# Patient Record
Sex: Male | Born: 1990 | Race: White | Hispanic: No | Marital: Single | Smoking: Current every day smoker
Health system: Southern US, Community
[De-identification: ages and names within clinical notes are randomized; demographics above are authoritative.]

## PROBLEM LIST (undated history)

## (undated) DIAGNOSIS — E7800 Pure hypercholesterolemia, unspecified: Secondary | ICD-10-CM

## (undated) DIAGNOSIS — I10 Essential (primary) hypertension: Secondary | ICD-10-CM

## (undated) HISTORY — DX: Pure hypercholesterolemia, unspecified: E78.00

## (undated) HISTORY — DX: Essential (primary) hypertension: I10

---
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
## 2010-04-11 ENCOUNTER — Emergency Department (HOSPITAL_COMMUNITY)
Admission: EM | Admit: 2010-04-11 | Discharge: 2010-04-11 | Disposition: A | Discharge status: Home / Self Care | Payer: Self-pay | Attending: Emergency Medicine | Admitting: Emergency Medicine

## 2010-04-11 ENCOUNTER — Emergency Department (HOSPITAL_COMMUNITY): Payer: Self-pay

## 2010-04-11 DIAGNOSIS — Y929 Unspecified place or not applicable: Secondary | ICD-10-CM | POA: Insufficient documentation

## 2010-04-11 DIAGNOSIS — S62339A Displaced fracture of neck of unspecified metacarpal bone, initial encounter for closed fracture: Secondary | ICD-10-CM | POA: Insufficient documentation

## 2010-04-11 DIAGNOSIS — M7989 Other specified soft tissue disorders: Secondary | ICD-10-CM | POA: Insufficient documentation

## 2010-04-11 DIAGNOSIS — W2209XA Striking against other stationary object, initial encounter: Secondary | ICD-10-CM | POA: Insufficient documentation

## 2010-04-11 DIAGNOSIS — M25649 Stiffness of unspecified hand, not elsewhere classified: Secondary | ICD-10-CM | POA: Insufficient documentation

## 2013-07-03 ENCOUNTER — Emergency Department (HOSPITAL_COMMUNITY)
Admission: EM | Admit: 2013-07-03 | Discharge: 2013-07-03 | Disposition: A | Discharge status: Home / Self Care | Payer: Medicaid Other | Attending: Emergency Medicine | Admitting: Emergency Medicine

## 2013-07-03 ENCOUNTER — Emergency Department (HOSPITAL_COMMUNITY): Payer: Medicaid Other

## 2013-07-03 ENCOUNTER — Encounter (HOSPITAL_COMMUNITY): Payer: Self-pay | Admitting: Emergency Medicine

## 2013-07-03 DIAGNOSIS — Y9289 Other specified places as the place of occurrence of the external cause: Secondary | ICD-10-CM | POA: Insufficient documentation

## 2013-07-03 DIAGNOSIS — W010XXA Fall on same level from slipping, tripping and stumbling without subsequent striking against object, initial encounter: Secondary | ICD-10-CM | POA: Insufficient documentation

## 2013-07-03 DIAGNOSIS — Y9389 Activity, other specified: Secondary | ICD-10-CM | POA: Insufficient documentation

## 2013-07-03 DIAGNOSIS — S20219A Contusion of unspecified front wall of thorax, initial encounter: Secondary | ICD-10-CM

## 2013-07-03 DIAGNOSIS — F17200 Nicotine dependence, unspecified, uncomplicated: Secondary | ICD-10-CM | POA: Insufficient documentation

## 2013-07-03 LAB — URINALYSIS, ROUTINE W REFLEX MICROSCOPIC
BILIRUBIN URINE: NEGATIVE
Glucose, UA: NEGATIVE mg/dL
Hgb urine dipstick: NEGATIVE
Leukocytes, UA: NEGATIVE
Nitrite: NEGATIVE
PH: 5.5 (ref 5.0–8.0)
Protein, ur: NEGATIVE mg/dL
Urobilinogen, UA: 0.2 mg/dL (ref 0.0–1.0)

## 2013-07-03 MED ORDER — METHOCARBAMOL 500 MG PO TABS: 500.0000 mg | ORAL_TABLET | Freq: Three times a day (TID) | ORAL | Status: DC

## 2013-07-03 MED ORDER — ONDANSETRON HCL 4 MG PO TABS
4.0000 mg | ORAL_TABLET | Freq: Once | ORAL | Status: AC
  Administered 2013-07-03: 4 mg via ORAL
  Filled 2013-07-03: qty 1

## 2013-07-03 MED ORDER — HYDROCODONE-ACETAMINOPHEN 5-325 MG PO TABS: 1.0000 | ORAL_TABLET | ORAL | Status: DC | PRN

## 2013-07-03 MED ORDER — KETOROLAC TROMETHAMINE 10 MG PO TABS
10.0000 mg | ORAL_TABLET | Freq: Once | ORAL | Status: AC
  Administered 2013-07-03: 10 mg via ORAL
  Filled 2013-07-03: qty 1

## 2013-07-03 MED ORDER — DICLOFENAC SODIUM 75 MG PO TBEC: 75.0000 mg | DELAYED_RELEASE_TABLET | Freq: Two times a day (BID) | ORAL | Status: DC

## 2013-07-03 MED ORDER — HYDROCODONE-ACETAMINOPHEN 5-325 MG PO TABS
2.0000 | ORAL_TABLET | Freq: Once | ORAL | Status: AC
  Administered 2013-07-03: 2 via ORAL
  Filled 2013-07-03: qty 2

## 2013-07-03 MED ORDER — DIAZEPAM 5 MG PO TABS
5.0000 mg | ORAL_TABLET | Freq: Once | ORAL | Status: AC
  Administered 2013-07-03: 5 mg via ORAL
  Filled 2013-07-03: qty 1

## 2013-07-03 NOTE — ED Provider Notes (Signed)
Medical screening examination/treatment/procedure(s) were performed by non-physician practitioner and as supervising physician I was immediately available for consultation/collaboration.   EKG Interpretation None      Krystle Polcyn, MD, FACEP   Jeziel Hoffmann L Kimela Malstrom, MD 07/03/13 1929 

## 2013-07-03 NOTE — ED Notes (Signed)
Pain  L lower ant ribs. Fell when in bath tub.   Pt had large elastic wrap around chest.  Says pain with urination.  Alert, skin warm and dry. Color wnl

## 2013-07-03 NOTE — ED Provider Notes (Signed)
CSN: [id number]     Arrival date & time 07/03/13  1712 History   First MD Initiated Contact with Patient 07/03/13 1809     Chief Complaint  Patient presents with  . Fall     (Consider location/radiation/quality/duration/timing/severity/associated sxs/prior Treatment) HPI Comments: Patient presents to the emergency department with the complaint of left rib area pain. The patient states that he slipped and fell in the bathtub earlier today and has been having pain since that time. The patient denies any loss of consciousness. He denies injuring his head or any other areas other than the chest. Since the time of the fall he has not been coughing up any blood, or passing any blood in his urine. He has not had any difficulty with his breathing or any other problems. He does have pain with certain movements involving the left chest area and he has pain with deep breathing or laughing or coughing. He has not taken anything for this up to this point. The patient denies being on any anticoagulation medications. He has no history of bleeding disorders.  The history is provided by the patient and the spouse.    History reviewed. No pertinent past medical history. History reviewed. No pertinent past surgical history. History reviewed. No pertinent family history. History  Substance Use Topics  . Smoking status: Current Every Day Smoker  . Smokeless tobacco: Not on file  . Alcohol Use: No    Review of Systems  Constitutional: Negative for activity change.       All ROS Neg except as noted in HPI  HENT: Negative for nosebleeds.   Eyes: Negative for photophobia and discharge.  Respiratory: Negative for cough, shortness of breath and wheezing.   Cardiovascular: Negative for chest pain and palpitations.  Gastrointestinal: Negative for abdominal pain and blood in stool.  Genitourinary: Negative for dysuria, frequency and hematuria.  Musculoskeletal: Negative for arthralgias, back pain and neck pain.   Skin: Negative.   Neurological: Negative for dizziness, seizures and speech difficulty.  Psychiatric/Behavioral: Negative for hallucinations and confusion.      Allergies  Review of patient's allergies indicates no known allergies.  Home Medications   Prior to Admission medications   Not on File   BP 126/67  Pulse 80  Temp(Src) 98.8 F (37.1 C) (Oral)  Resp 18  Ht 5\' 8"  (1.727 m)  Wt 175 lb (79.379 kg)  BMI 26.61 kg/m2  SpO2 98% Physical Exam  Nursing note and vitals reviewed. Constitutional: He is oriented to person, place, and time. He appears well-developed and well-nourished.  Non-toxic appearance.  HENT:  Head: Normocephalic.  Right Ear: Tympanic membrane and external ear normal.  Left Ear: Tympanic membrane and external ear normal.  Eyes: EOM and lids are normal. Pupils are equal, round, and reactive to light.  Neck: Normal range of motion. Neck supple. Carotid bruit is not present.  Cardiovascular: Normal rate, regular rhythm, normal heart sounds, intact distal pulses and normal pulses.   Pulmonary/Chest: Breath sounds normal. No respiratory distress.  There is left lateral and anterior chest wall tenderness. There is no bruising appreciated. There is no palpable deformity appreciated. There is symmetrical rise and fall of the chest. The patient speaks in complete sentences.  Abdominal: Soft. Bowel sounds are normal. There is no tenderness. There is no guarding.  Musculoskeletal: Normal range of motion.  Lymphadenopathy:       Head (right side): No submandibular adenopathy present.       Head (left side): No submandibular adenopathy  present.    He has no cervical adenopathy.  Neurological: He is alert and oriented to person, place, and time. He has normal strength. No cranial nerve deficit or sensory deficit.  Skin: Skin is warm and dry.  Psychiatric: He has a normal mood and affect. His speech is normal.    ED Course  Procedures (including critical care  time) Labs Review Labs Reviewed  URINALYSIS, ROUTINE W REFLEX MICROSCOPIC - Abnormal; Notable for the following:    Specific Gravity, Urine >1.030 (*)    Ketones, ur TRACE (*)    All other components within normal limits    Imaging Review Dg Ribs Unilateral W/chest Left  07/03/2013   CLINICAL DATA:  Left rib pain.  Fall.  EXAM: LEFT RIBS AND CHEST - 3+ VIEW  COMPARISON:  None.  FINDINGS: No displaced rib fracture or pneumothorax identified. Cardiopericardial silhouette appears within normal limits. No left-sided pleural fluid/effusion.  IMPRESSION: No displaced rib fracture.   Electronically Signed   By: Andreas NewportGeoffrey  Lamke M.D.   On: 07/03/2013 18:20     EKG Interpretation None      MDM X-ray of the left ribs is read as negative. X-ray of the chest shows no acute abnormality. The pulse oximetry is 98% on room air. Within normal limits by my interpretation. The patient speaks in complete sentences without problem.  The plan at this time is for the patient to use incentives barometer over the next few days. The patient will be treated with baclofen, Norco, and diclofenac. Patient is to followup with his primary physician if any changes, problems, or concerns.    Final diagnoses:  None    **I have reviewed nursing notes, vital signs, and all appropriate lab and imaging results for this patient.Kathie Dike*    Lizandro Spellman M Cornelius Schuitema, PA-C 07/03/13 1913

## 2013-07-03 NOTE — Discharge Instructions (Signed)
Your rib x-ray and chest x-ray are negative for acute problem. Please use the incentive spirometer 3-4 times daily for the next 5-7 days. Please use diclofenac 2 times daily with food until all taken. Robaxin and Norco may cause drowsiness, please use with caution. Please see your [facility] access physician in approximately one week for recheck and evaluation of your injury. Chest Contusion A contusion is a deep bruise. Bruises happen when an injury causes bleeding under the skin. Signs of bruising include pain, puffiness (swelling), and discolored skin. The bruise may turn blue, purple, or yellow.  HOME CARE  Put ice on the injured area.  Put ice in a plastic bag.  Place a towel between the skin and the bag.  Leave the ice on for 15-20 minutes at a time, 03-04 times a day for the first 48 hours.  Only take medicine as told by your doctor.  Rest.  Take deep breaths (deep-breathing exercises) as told by your doctor.  Stop smoking if you smoke.  Do not lift objects over 5 pounds (2.3 kilograms) for 3 days or longer if told by your doctor. GET HELP RIGHT AWAY IF:   You have more bruising or puffiness.  You have pain that gets worse.  You have trouble breathing.  You are dizzy, weak, or pass out (faint).  You have blood in your pee (urine) or poop (stool).  You cough up or throw up (vomit) blood.  Your puffiness or pain is not helped with medicines. MAKE SURE YOU:   Understand these instructions.  Will watch your condition.  Will get help right away if you are not doing well or get worse. Document Released: 07/28/2007 Document Revised: 11/03/2011 Document Reviewed: 08/02/2011 [facility] Patient Information 2014 [place], [place].  or

## 2013-07-03 NOTE — ED Notes (Signed)
Pt c/o left rib pain that began earlier today after a fall. Pt states he slipped in bathtub. Pt denies hitting head. Denies LOC.

## 2013-07-03 NOTE — ED Notes (Signed)
Awaiting RT to get  IS  inst

## 2016-01-08 IMAGING — CR DG RIBS W/ CHEST 3+V*L*
4 series · 4 of 4 positions shown · non-contrast
Comparison: None.

CLINICAL DATA: Left rib pain.  Fall.

EXAM:
LEFT RIBS AND CHEST - 3+ VIEW

[view not recorded (1 of 4)]
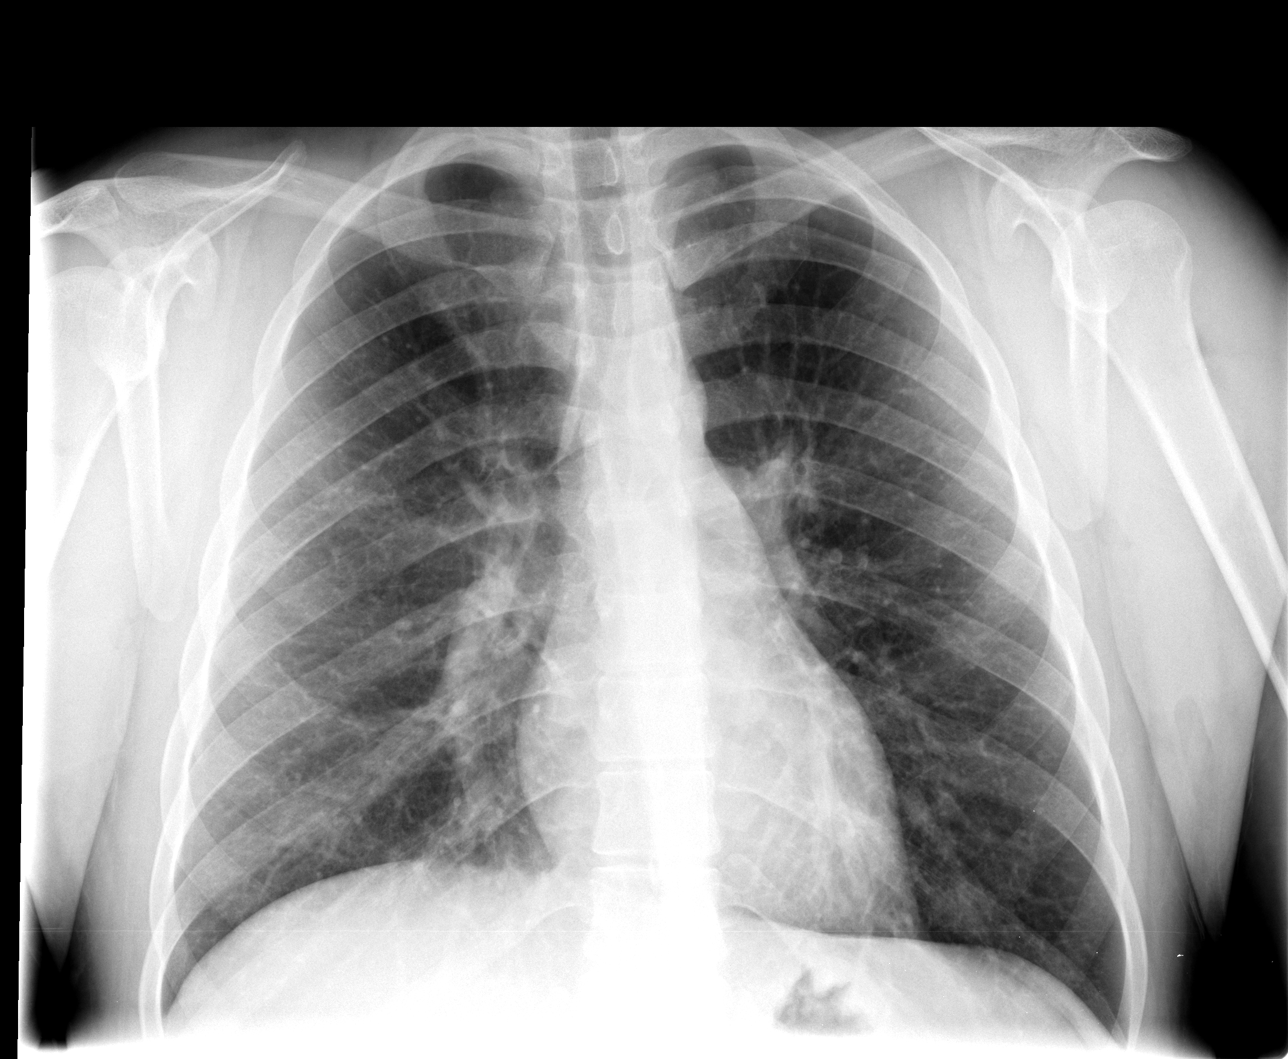

[view not recorded (2 of 4)]
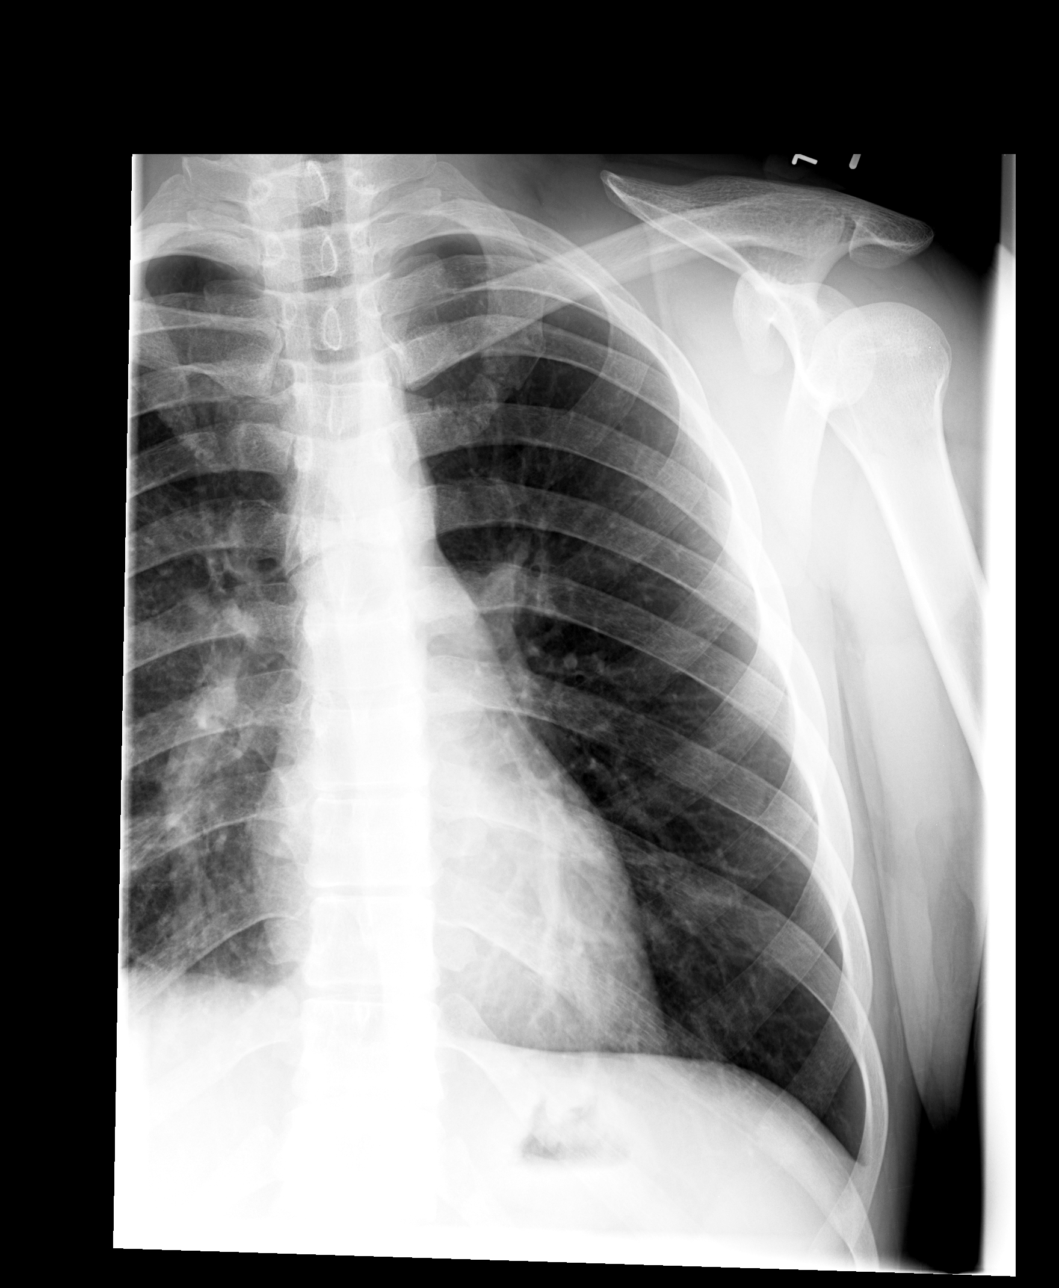

[view not recorded (3 of 4)]
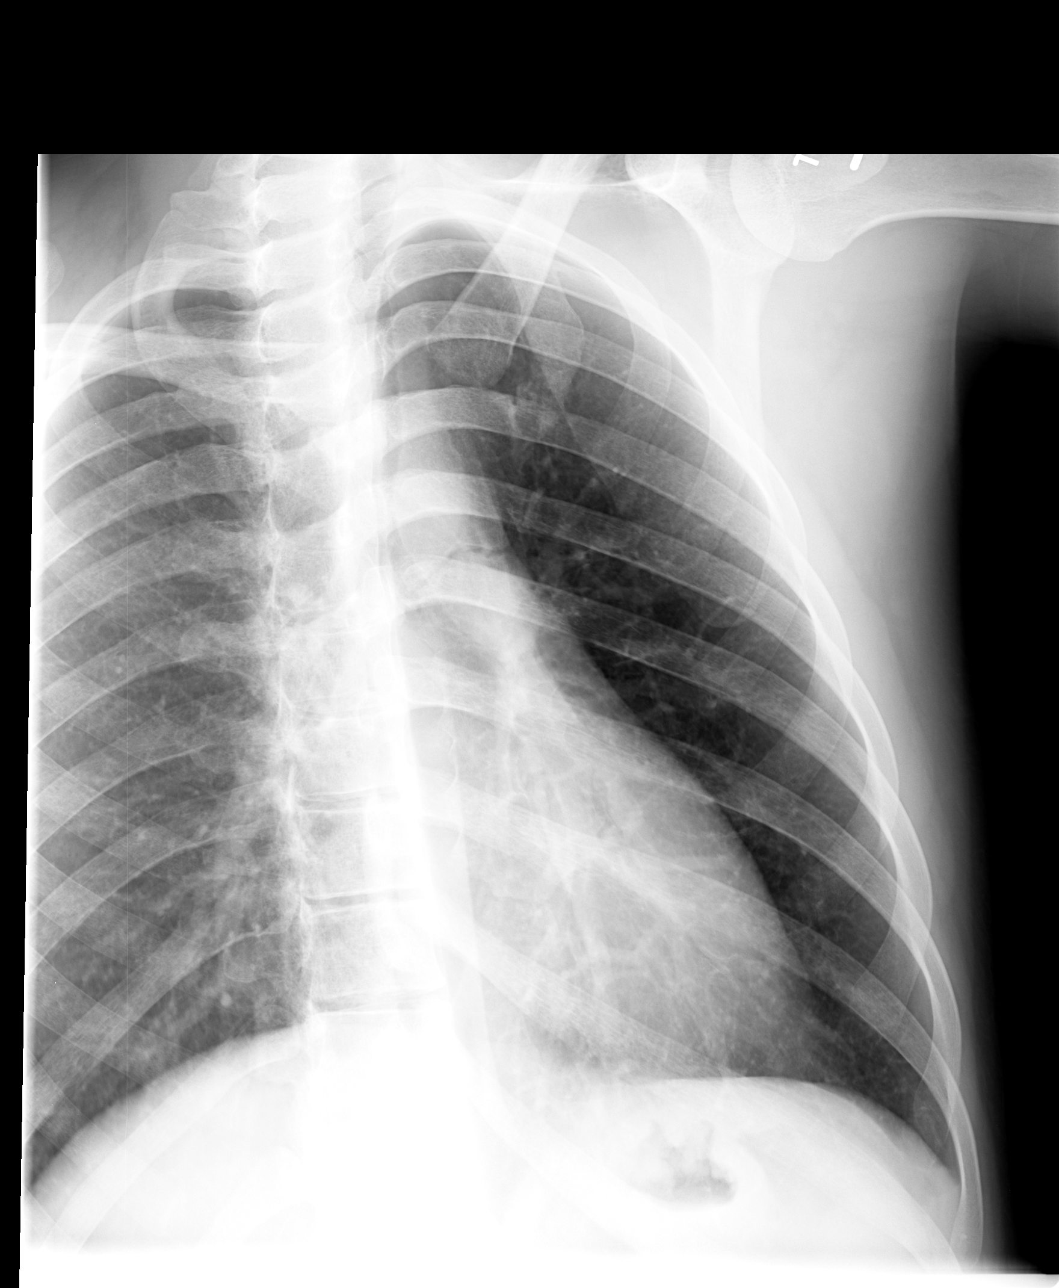

[view not recorded (4 of 4)]
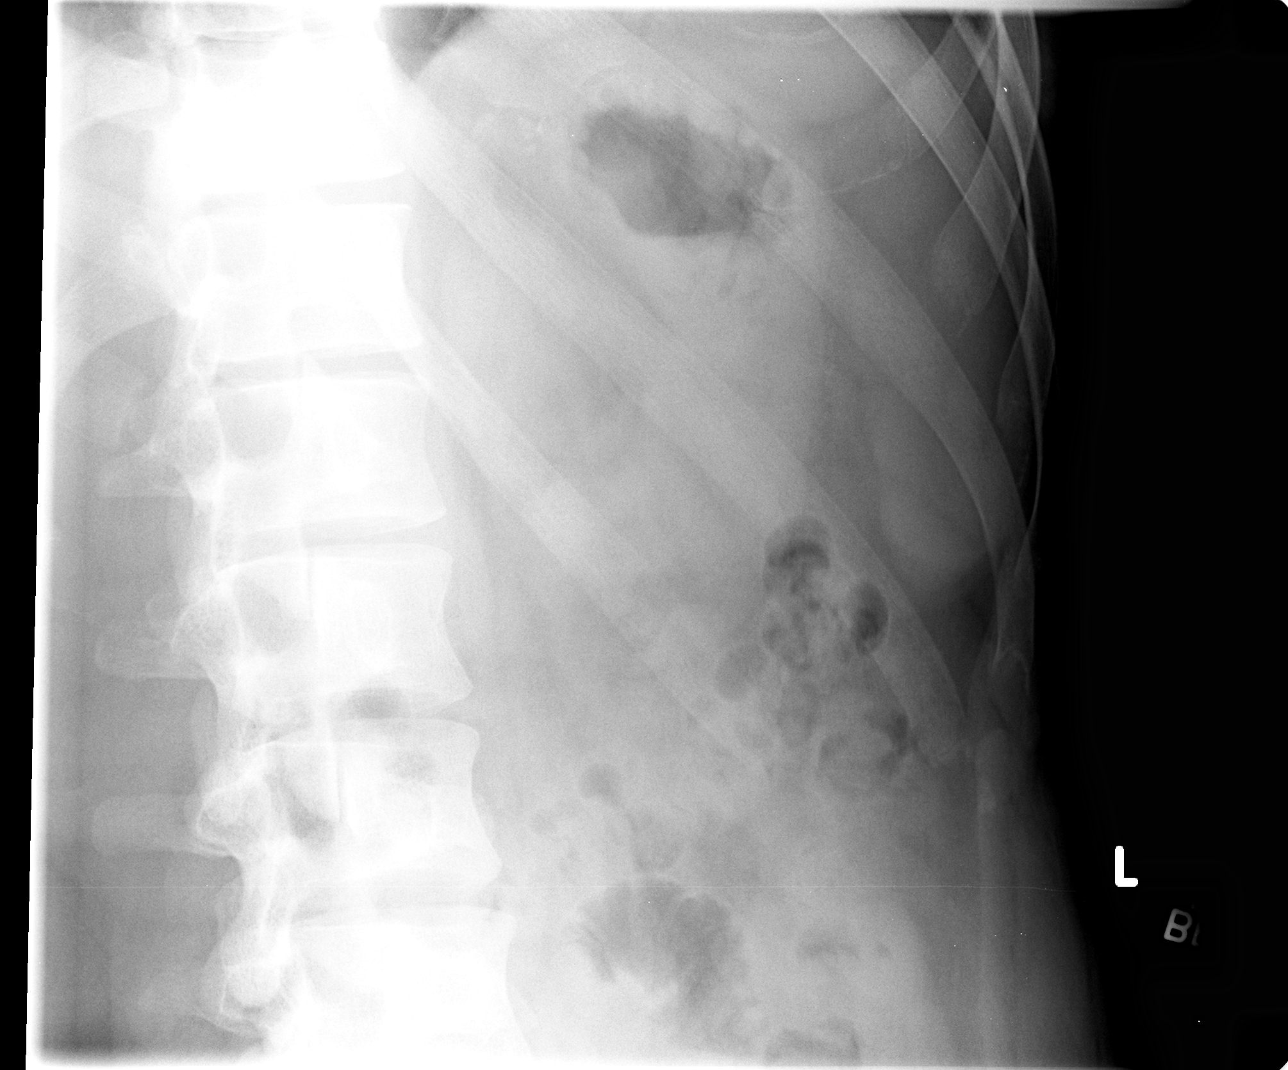

[4 of 4 positions shown; findings below may reference images not displayed]

FINDINGS: No displaced rib fracture or pneumothorax identified.
Cardiopericardial silhouette appears within normal limits. No
left-sided pleural fluid/effusion.
IMPRESSION: No displaced rib fracture.

## 2022-10-07 ENCOUNTER — Encounter (INDEPENDENT_AMBULATORY_CARE_PROVIDER_SITE_OTHER): Payer: Self-pay | Admitting: *Deleted

## 2022-10-14 ENCOUNTER — Encounter (INDEPENDENT_AMBULATORY_CARE_PROVIDER_SITE_OTHER): Payer: Self-pay | Admitting: Gastroenterology

## 2022-10-14 ENCOUNTER — Telehealth: Payer: Self-pay | Admitting: *Deleted

## 2022-10-14 ENCOUNTER — Ambulatory Visit (INDEPENDENT_AMBULATORY_CARE_PROVIDER_SITE_OTHER): Payer: Medicaid Other | Admitting: Gastroenterology

## 2022-10-14 VITALS — BP 130/86 | HR 90 | Temp 97.5°F | Ht 69.0 in | Wt 184.1 lb

## 2022-10-14 DIAGNOSIS — R109 Unspecified abdominal pain: Secondary | ICD-10-CM | POA: Insufficient documentation

## 2022-10-14 DIAGNOSIS — K219 Gastro-esophageal reflux disease without esophagitis: Secondary | ICD-10-CM

## 2022-10-14 DIAGNOSIS — R1084 Generalized abdominal pain: Secondary | ICD-10-CM

## 2022-10-14 MED ORDER — OMEPRAZOLE 40 MG PO CPDR: 40.0000 mg | DELAYED_RELEASE_CAPSULE | Freq: Two times a day (BID) | ORAL | 3 refills | Status: AC

## 2022-10-14 NOTE — Patient Instructions (Signed)
Schedule CT abdomen/pelvis with IV contrast If negative imaging findings, will schedule EGD and colonoscopy Increase omeprazole 40 mg twice a day

## 2022-10-14 NOTE — Telephone Encounter (Signed)
[employer] PA: Authorization # [id number] DOS: 10/14/22-11/28/22  Pt's wife Lafonda Mosses (on [place]) informed CT is scheduled for 10/21/22. Pt will need to arrive at 10:15 am to check in, NPO 4 hours prior and pt is being worked in for the CT

## 2022-10-14 NOTE — Progress Notes (Signed)
Katrinka Blazing, M.D. Gastroenterology & Hepatology [facility] Gastroenterology [address] [place], [place] [address] Primary Care Physician: Toma Deiters, MD [address] [place] [address]  Referring MD: PCP  Chief Complaint: Abdominal pain and bloating  History of Present Illness: Anthony Mack is a 32 y.o. male with PMH HTN and HLD, who presents for evaluation of abdominal pain and bloating.  Patient reports that for the last 8 years he has presented intermittent episodes of abdominal pain diffusely, which he describes as "if someone is grabbing my insides". He reports that for the last 6 months this has become a more frequent thing as he is having the pain every day. He reports that the pain can last almost all day long, which improves with cold beverages. He does not take anything for the pain. He reports that he also takes Tums often for this but he does not have significant relief. Sometimes he vomits, and this helps with the pain. He reports that sometimes moving his bowels helps with the pain. States that he moves his bowels at least 3 Bms per day, sometimes more - stools are usually loose and can have some melanotic appearance. Sometimes has issues with bloating.  States he has had chronic heartburn with the use of omeprazole 40 mg qday. Usually takes it at AM and skips breakfast. States Zantac did not provide relief in the past so he has been taking omeprazole for multiple years.  He reports having heartburn every other day, and has affected his sleep. No dysphagia or odynophagia.  The patient denies having any nausea, vomiting, fever, chills, hematochezia, hematemesis, diarrhea, jaundice, pruritus or weight loss.  Last [facility] Last Colonoscopy:never  FHx: neg for any gastrointestinal/liver disease, mother breast cancer, grandparent stomach cancer Social: smoking 1 pack a day, neg alcohol or illicit drug use Surgical: no abdominal  surgeries  Past Medical History: Past Medical History:  Diagnosis Date   HTN (hypertension)    Hypercholesteremia     Past Surgical History:History reviewed. No pertinent surgical history.  Family History:History reviewed. No pertinent family history.  Social History: Social History   Tobacco Use  Smoking Status Every Day   Types: Cigarettes  Smokeless Tobacco Not on file   Social History   Substance and Sexual Activity  Alcohol Use No   Social History   Substance and Sexual Activity  Drug Use No    Allergies: No Known Allergies  Medications: Current Outpatient Medications  Medication Sig Dispense Refill   aspirin EC 81 MG tablet Take 81 mg by mouth daily. Swallow whole.     atorvastatin (LIPITOR) 40 MG tablet Take 40 mg by mouth daily.     losartan (COZAAR) 100 MG tablet Take 100 mg by mouth daily.     nitroGLYCERIN (NITROSTAT) 0.4 MG SL tablet Place 0.4 mg under the tongue every 5 (five) minutes as needed for chest pain.     Omega-3 Fatty Acids (FISH OIL) 1000 MG CAPS Take by mouth 3 (three) times daily.     omeprazole (PRILOSEC) 40 MG capsule Take 40 mg by mouth daily.     No current facility-administered medications for this visit.    Review of Systems: GENERAL: negative for malaise, night sweats HEENT: No changes in hearing or vision, no nose bleeds or other nasal problems. NECK: Negative for lumps, goiter, pain and significant neck swelling RESPIRATORY: Negative for cough, wheezing CARDIOVASCULAR: Negative for chest pain, leg swelling, palpitations, orthopnea GI: SEE HPI MUSCULOSKELETAL:  Negative for joint pain or swelling, back pain, and muscle pain. SKIN: Negative for lesions, rash PSYCH: Negative for sleep disturbance, mood disorder and recent psychosocial stressors. HEMATOLOGY Negative for prolonged bleeding, bruising easily, and swollen nodes. ENDOCRINE: Negative for cold or heat intolerance, polyuria, polydipsia and goiter. NEURO: negative for  tremor, gait imbalance, syncope and seizures. The remainder of the review of systems is noncontributory.   Physical Exam: BP 130/86 (BP Location: Left Arm, Patient Position: Sitting, Cuff Size: Normal)   Pulse 90   Temp (!) 97.5 F (36.4 C) (Temporal)   Ht 5\' 9"  (1.753 m)   Wt 184 lb 1.6 oz (83.5 kg)   BMI 27.19 kg/m  GENERAL: The patient is AO x3, in no acute distress. HEENT: Head is normocephalic and atraumatic. EOMI are intact. Mouth is well hydrated and without lesions. NECK: Supple. No masses LUNGS: Clear to auscultation. No presence of rhonchi/wheezing/rales. Adequate chest expansion HEART: RRR, normal s1 and s2. ABDOMEN: Soft, nontender, no guarding, no peritoneal signs, and nondistended. BS +. No masses. EXTREMITIES: Without any cyanosis, clubbing, rash, lesions or edema. NEUROLOGIC: AOx3, no focal motor deficit. SKIN: no jaundice, no rashes   Imaging/Labs: as above  I personally reviewed and interpreted the available labs, imaging and endoscopic files.  Impression and Plan: Anthony Mack is a 32 y.o. male with PMH HTN and HLD, who presents for evaluation of abdominal pain and bloating.  Patient has presented chronic pain with intermittent bloating, which has aggravated recently.  Has not presented any associated red flag signs but given the severity and increasing frequency we will need to evaluate for organic intra-abdominal pathologies.  Will proceed with a CT of the abdomen pelvis with IV contrast, but if negative will proceed with an EGD and colonoscopy with random biopsies.  As he is having recurrent episodes of heartburn despite taking PPI on a daily basis, we will increase this to 40 mg twice a day.  -Schedule CT abdomen/pelvis with IV contrast -If negative imaging findings, will schedule EGD and colonoscopy -Increase omeprazole 40 mg twice a day  All questions were answered.      Katrinka Blazing, MD Gastroenterology and Hepatology [facility]  Gastroenterology

## 2022-10-21 ENCOUNTER — Ambulatory Visit (HOSPITAL_COMMUNITY)
Admission: RE | Admit: 2022-10-21 | Discharge: 2022-10-21 | Disposition: A | Discharge status: Home / Self Care | Payer: Medicaid Other | Source: Ambulatory Visit | Attending: Gastroenterology | Admitting: Gastroenterology

## 2022-10-21 DIAGNOSIS — R1084 Generalized abdominal pain: Secondary | ICD-10-CM | POA: Insufficient documentation

## 2022-10-21 MED ORDER — IOHEXOL 300 MG/ML  SOLN
100.0000 mL | Freq: Once | INTRAMUSCULAR | Status: AC | PRN
  Administered 2022-10-21: 100 mL via INTRAVENOUS

## 2022-10-26 ENCOUNTER — Other Ambulatory Visit (INDEPENDENT_AMBULATORY_CARE_PROVIDER_SITE_OTHER): Payer: Self-pay | Admitting: Gastroenterology

## 2022-10-26 MED ORDER — PEG 3350-KCL-NA BICARB-NACL 420 G PO SOLR: 4000.0000 mL | Freq: Once | ORAL | 0 refills | Status: AC

## 2022-10-28 ENCOUNTER — Encounter (HOSPITAL_COMMUNITY): Admission: RE | Payer: Self-pay | Source: Home / Self Care

## 2022-10-28 ENCOUNTER — Ambulatory Visit (HOSPITAL_COMMUNITY): Admission: RE | Admit: 2022-10-28 | Payer: Medicaid Other | Source: Home / Self Care | Admitting: Gastroenterology

## 2022-10-28 SURGERY — COLONOSCOPY WITH PROPOFOL
Anesthesia: Monitor Anesthesia Care

## 2023-01-18 ENCOUNTER — Ambulatory Visit (INDEPENDENT_AMBULATORY_CARE_PROVIDER_SITE_OTHER): Payer: Medicaid Other | Admitting: Gastroenterology

## 2023-10-19 ENCOUNTER — Other Ambulatory Visit (INDEPENDENT_AMBULATORY_CARE_PROVIDER_SITE_OTHER): Payer: Self-pay | Admitting: Gastroenterology

## 2023-10-19 DIAGNOSIS — K219 Gastro-esophageal reflux disease without esophagitis: Secondary | ICD-10-CM

## 2023-10-19 NOTE — Telephone Encounter (Signed)
 Needs office visit last seen 10/13/2022

## 2023-12-28 ENCOUNTER — Encounter (INDEPENDENT_AMBULATORY_CARE_PROVIDER_SITE_OTHER): Payer: Self-pay | Admitting: Gastroenterology
